# Patient Record
Sex: Male | Born: 1960 | Race: White | Marital: Married | Smoking: Never smoker
Health system: Southern US, Community
[De-identification: ages and names within clinical notes are randomized; demographics above are authoritative.]

## PROBLEM LIST (undated history)

## (undated) HISTORY — PX: POLYPECTOMY: SHX149

---

## 2003-12-18 HISTORY — PX: VASECTOMY: SHX75

## 2013-12-17 HISTORY — PX: COLONOSCOPY: SHX174

## 2014-01-11 ENCOUNTER — Encounter: Payer: Self-pay | Admitting: Internal Medicine

## 2014-02-18 ENCOUNTER — Encounter: Payer: Self-pay | Admitting: Internal Medicine

## 2014-02-18 ENCOUNTER — Ambulatory Visit (AMBULATORY_SURGERY_CENTER): Payer: Self-pay | Admitting: *Deleted

## 2014-02-18 VITALS — Ht 73.0 in | Wt 207.2 lb

## 2014-02-18 DIAGNOSIS — Z1211 Encounter for screening for malignant neoplasm of colon: Secondary | ICD-10-CM

## 2014-02-18 MED ORDER — MOVIPREP 100 G PO SOLR: ORAL | Status: DC

## 2014-02-18 NOTE — Progress Notes (Signed)
No allergies to eggs or soy. No prior anesthesia.  

## 2014-02-26 ENCOUNTER — Encounter: Payer: Self-pay | Admitting: Internal Medicine

## 2014-02-26 ENCOUNTER — Ambulatory Visit (AMBULATORY_SURGERY_CENTER): Payer: BC Managed Care – PPO | Admitting: Internal Medicine

## 2014-02-26 VITALS — BP 133/78 | HR 59 | Temp 97.3°F | Resp 17 | Ht 73.0 in | Wt 207.0 lb

## 2014-02-26 DIAGNOSIS — D126 Benign neoplasm of colon, unspecified: Secondary | ICD-10-CM

## 2014-02-26 DIAGNOSIS — Z1211 Encounter for screening for malignant neoplasm of colon: Secondary | ICD-10-CM

## 2014-02-26 MED ORDER — SODIUM CHLORIDE 0.9 % IV SOLN: 500.0000 mL | INTRAVENOUS | Status: DC

## 2014-02-26 NOTE — Patient Instructions (Signed)
YOU HAD AN ENDOSCOPIC PROCEDURE TODAY AT THE [facility]: Refer to the procedure report that was given to you for any specific questions about what was found during the examination.  If the procedure report does not answer your questions, please call your gastroenterologist to clarify.  If you requested that your care partner not be given the details of your procedure findings, then the procedure report has been included in a sealed envelope for you to review at your convenience later.  YOU SHOULD EXPECT: Some feelings of bloating in the abdomen. Passage of more gas than usual.  Walking can help get rid of the air that was put into your GI tract during the procedure and reduce the bloating. If you had a lower endoscopy (such as a colonoscopy or flexible sigmoidoscopy) you may notice spotting of blood in your stool or on the toilet paper. If you underwent a bowel prep for your procedure, then you may not have a normal bowel movement for a few days.  DIET: Your first meal following the procedure should be a light meal and then it is ok to progress to your normal diet.  A half-sandwich or bowl of soup is an example of a good first meal.  Heavy or fried foods are harder to digest and may make you feel nauseous or bloated.  Likewise meals heavy in dairy and vegetables can cause extra gas to form and this can also increase the bloating.  Drink plenty of fluids but you should avoid alcoholic beverages for 24 hours.  ACTIVITY: Your care partner should take you home directly after the procedure.  You should plan to take it easy, moving slowly for the rest of the day.  You can resume normal activity the day after the procedure however you should NOT DRIVE or use heavy machinery for 24 hours (because of the sedation medicines used during the test).    SYMPTOMS TO REPORT IMMEDIATELY: A gastroenterologist can be reached at any hour.  During normal business hours, 8:30 AM to 5:00 PM Monday through Friday,  call [phone].  After hours and on weekends, please call the GI answering service at [phone] who will take a message and have the physician on call contact you.   Following lower endoscopy (colonoscopy or flexible sigmoidoscopy):  Excessive amounts of blood in the stool  Significant tenderness or worsening of abdominal pains  Swelling of the abdomen that is new, acute  Fever of 100F or higher    FOLLOW UP: If any biopsies were taken you will be contacted by phone or by letter within the next 1-3 weeks.  Call your gastroenterologist if you have not heard about the biopsies in 3 weeks.  Our staff will call the home number listed on your records the next business day following your procedure to check on you and address any questions or concerns that you may have at that time regarding the information given to you following your procedure. This is a courtesy call and so if there is no answer at the home number and we have not heard from you through the emergency physician on call, we will assume that you have returned to your regular daily activities without incident.  SIGNATURES/CONFIDENTIALITY: You and/or your care partner have signed paperwork which will be entered into your electronic medical record.  These signatures attest to the fact that that the information above on your After Visit Summary has been reviewed and is understood.  Full responsibility of the confidentiality   this discharge information lies with you and/or your care-partner.   INFORMATION ON POLYPS GIVEN TO YOU TODAY   AWAIT PATHOLOGY RESULTS  

## 2014-02-26 NOTE — Op Note (Signed)
[facility]  [address] [place], [address]   COLONOSCOPY PROCEDURE REPORT  PATIENT: Tommy Ramirez, Tommy Ramirez  MR#: [id number] BIRTHDATE: Mar 11, 1961 , 52  yrs. old GENDER: Male ENDOSCOPIST: Jerene Bears, MD REFERRED [place]:PXTG Virgina Jock, M.D. PROCEDURE DATE:  02/26/2014 PROCEDURE:   Colonoscopy with snare polypectomy First Screening Colonoscopy - Avg.  risk and is 50 yrs.  old or older Yes.  Prior Negative Screening - Now for repeat screening. N/A  History of Adenoma - Now for follow-up colonoscopy & has been > or = to 3 yrs.  N/A  Polyps Removed Today? Yes. ASA CLASS:   Class II INDICATIONS:average risk screening and first colonoscopy. MEDICATIONS: MAC sedation, administered by CRNA and Propofol (Diprivan) 550 mg IV  DESCRIPTION OF PROCEDURE:   After the risks benefits and alternatives of the procedure were thoroughly explained, informed consent was obtained.  A digital rectal exam revealed no rectal mass.   The LB [id number]  endoscope was introduced through the anus and advanced to the cecum, which was identified by both the appendix and ileocecal valve. No adverse events experienced. The quality of the prep was good, using MoviPrep  The instrument was then slowly withdrawn as the colon was fully examined.   COLON FINDINGS: Two sessile polyps measuring 4-5 mm in size were found in the ascending colon.  Polypectomy was performed using cold snare.  All resections were complete and all polyp tissue was completely retrieved.   The colon mucosa was otherwise normal. Retroflexed views revealed no abnormalities. The time to cecum=3 minutes 08 seconds.  Withdrawal time=16 minutes 20 seconds.  The scope was withdrawn and the procedure completed. COMPLICATIONS: There were no complications.  ENDOSCOPIC IMPRESSION: 1.   Two sessile polyps measuring 4-5 mm in size were found in the ascending colon; Polypectomy was performed using cold snare 2.   The colon mucosa was otherwise  normal  RECOMMENDATIONS: 1.  Await pathology results 2.  If the polyp(s) removed today are proven to be adenomatous (pre-cancerous) polyps, you will need a repeat colonoscopy in 5 years.  Otherwise you should continue to follow colorectal cancer screening guidelines for "routine risk" patients with colonoscopy in 10 years.  You will receive a letter within 1-2 weeks with the results of your biopsy as well as final recommendations.  Please call my office if you have not received a letter after 3 weeks.   eSigned:  Jerene Bears, MD 02/26/2014 10:21 AM cc: The Patient and Shon Baton, MD

## 2014-02-26 NOTE — Progress Notes (Signed)
Called to room to assist during endoscopic procedure.  Patient ID and intended procedure confirmed with present staff. Received instructions for my participation in the procedure from the performing physician.  

## 2014-03-01 ENCOUNTER — Telehealth: Payer: Self-pay | Admitting: *Deleted

## 2014-03-01 NOTE — Telephone Encounter (Signed)
Left message that we called for f/u 

## 2014-03-03 ENCOUNTER — Encounter: Payer: Self-pay | Admitting: Internal Medicine

## 2015-01-25 ENCOUNTER — Other Ambulatory Visit: Payer: Self-pay | Admitting: Internal Medicine

## 2015-01-25 DIAGNOSIS — Z803 Family history of malignant neoplasm of breast: Secondary | ICD-10-CM

## 2017-03-18 ENCOUNTER — Encounter: Payer: Self-pay | Admitting: Cardiovascular Disease

## 2017-04-01 ENCOUNTER — Encounter (INDEPENDENT_AMBULATORY_CARE_PROVIDER_SITE_OTHER): Payer: Self-pay

## 2017-04-01 ENCOUNTER — Ambulatory Visit (INDEPENDENT_AMBULATORY_CARE_PROVIDER_SITE_OTHER): Payer: 59 | Admitting: Cardiovascular Disease

## 2017-04-01 ENCOUNTER — Encounter: Payer: Self-pay | Admitting: Cardiovascular Disease

## 2017-04-01 VITALS — BP 118/80 | HR 70 | Ht 73.0 in | Wt 203.4 lb

## 2017-04-01 DIAGNOSIS — E785 Hyperlipidemia, unspecified: Secondary | ICD-10-CM

## 2017-04-01 DIAGNOSIS — Z8249 Family history of ischemic heart disease and other diseases of the circulatory system: Secondary | ICD-10-CM | POA: Diagnosis not present

## 2017-04-01 DIAGNOSIS — R0609 Other forms of dyspnea: Secondary | ICD-10-CM

## 2017-04-01 NOTE — Progress Notes (Signed)
Cardiology Office Note Date:  04/01/2017   ID:  Sheran Spine, DOB November 13, 1961, MRN [id number]  PCP:  Precious Reel, MD  Cardiologist:  Sherren Mocha, MD    Chief Complaint  Patient presents with  . New Patient (Initial Visit)    hyperlipidemia/family hx cad/doe     History of Present Illness: Tommy Ramirez is a 56 y.o. male who presents for evaluation of shortness of breath.   He has no known hx of CAD. He plays in a men's hockey league and has noted exertional dyspnea this year. No orthopnea, PND, or chest pain. No other exertional symptoms. Feels like he can't keep up with the other players and hasn't had issues with exercise capacity in the past. Denies cough, wheezing, fever, chills, or other complaints. Some limitation from knee pain at present.   He is also concerned about long-term CV risk and would like further risk assessment. CV risk factors include family hx CAD (no premature CAD) and hyperlipidemia (LDL 131 mg/dL).   History reviewed. No pertinent past medical history.  Past Surgical History:  Procedure Laterality Date  . VASECTOMY  2005    Current Outpatient Prescriptions  Medication Sig Dispense Refill  . naproxen sodium (ANAPROX) 220 MG tablet Take 440 mg by mouth 2 (two) times daily as needed (knee inflammation).     No current facility-administered medications for this visit.     Allergies:   Patient has no known allergies.   Social History:  The patient  reports that he has never smoked. He quit smokeless tobacco use about 36 years ago. He reports that he drinks about 8.4 oz of alcohol per week . He reports that he does not use drugs.   Family History:  The patient's  family history includes Cancer in his paternal grandfather and paternal grandmother; Heart attack in his maternal grandfather and mother; Heart murmur in his mother; Hyperlipidemia in his mother; Hypertension in his maternal grandmother; Irregular heart beat in his mother.    ROS:  Please  see the history of present illness.  Otherwise, review of systems is positive for snoring, knee pain.  All other systems are reviewed and negative.    PHYSICAL EXAM: VS:  BP 118/80   Pulse 70   Ht 6\' 1"  (1.854 m)   Wt 203 lb 6.4 oz (92.3 kg)   BMI 26.84 kg/m  , BMI Body mass index is 26.84 kg/m. GEN: Well nourished, well developed, in no acute distress  HEENT: normal  Neck: no JVD, no masses. No carotid bruits Cardiac: RRR without murmur or gallop                Respiratory:  clear to auscultation bilaterally, normal work of breathing GI: soft, nontender, nondistended, + BS MS: no deformity or atrophy  Ext: no pretibial edema, pedal pulses 2+= bilaterally Skin: warm and dry, no rash Neuro:  Strength and sensation are intact Psych: euthymic mood, full affect  EKG:  EKG is ordered today. The ekg ordered today shows NSR 70 bpm, within normal limits  Recent Labs: No results found for requested labs within last 8760 hours.   Lipid Panel  No results found for: CHOL, TRIG, HDL, CHOLHDL, VLDL, LDLCALC, LDLDIRECT    Wt Readings from Last 3 Encounters:  04/01/17 203 lb 6.4 oz (92.3 kg)  02/26/14 207 lb (93.9 kg)  02/18/14 207 lb 3.2 oz (94 kg)     ASSESSMENT AND PLAN: 1.  Shortness of breath: no abnormalities appreciated on his  exam. No smoking history or occupational exposures. Will check 2D echo and exercise treadmill study to assess LV/RV function, presence of any valvular disease, and assess exercise capacity/ischemia evaluation.  2. Hyperlipidemia: chol 207, LDL 131, HDL 49. Check coronary CT for Ca++. Explained rationale to patient. Lifestyle modification discussed at length. Consider statin if high Ca++ score.  Current medicines are reviewed with the patient today.  The patient does not have concerns regarding medicines.  Labs/ tests ordered today include:  No orders of the defined types were placed in this encounter.   Disposition:   FU one year  Signed, Sherren Mocha, MD  04/01/2017 11:25 AM    [facility] [address], [place], [place]  [address] Phone: [phone]; Fax: [phone]

## 2017-04-01 NOTE — Patient Instructions (Addendum)
Medication Instructions:  Your physician recommends that you continue on your current medications as directed. Please refer to the Current Medication list given to you today.  Labwork: No new orders.   Testing/Procedures: Your physician has requested that you have an echocardiogram. Echocardiography is a painless test that uses sound waves to create images of your heart. It provides your doctor with information about the size and shape of your heart and how well your heart's chambers and valves are working. This procedure takes approximately one hour. There are no restrictions for this procedure.  Your physician has requested a Coronary Calcium Score.  This test is $150.00 out of pocket at time of test.   Your physician has requested that you have an exercise tolerance test. For further information please visit [email or web address]. Please contact the office is you would like to proceed with this test.   Follow-Up: Your physician wants you to follow-up in: 1 YEAR with Dr Burt Knack.  You will receive a reminder letter in the mail two months in advance. If you don't receive a letter, please call our office to schedule the follow-up appointment.   Any Other Special Instructions Will Be Listed Below (If Applicable).     If you need a refill on your cardiac medications before your next appointment, please call your pharmacy.

## 2017-04-15 ENCOUNTER — Ambulatory Visit (INDEPENDENT_AMBULATORY_CARE_PROVIDER_SITE_OTHER)
Admission: RE | Admit: 2017-04-15 | Discharge: 2017-04-15 | Disposition: A | Discharge status: Home / Self Care | Payer: Self-pay | Source: Ambulatory Visit | Attending: Cardiovascular Disease | Admitting: Cardiovascular Disease

## 2017-04-15 ENCOUNTER — Ambulatory Visit (HOSPITAL_COMMUNITY): Payer: 59 | Attending: Cardiology

## 2017-04-15 ENCOUNTER — Other Ambulatory Visit: Payer: Self-pay

## 2017-04-15 DIAGNOSIS — R0602 Shortness of breath: Secondary | ICD-10-CM

## 2017-04-15 DIAGNOSIS — I420 Dilated cardiomyopathy: Secondary | ICD-10-CM | POA: Insufficient documentation

## 2017-04-15 DIAGNOSIS — I5030 Unspecified diastolic (congestive) heart failure: Secondary | ICD-10-CM | POA: Insufficient documentation

## 2017-04-15 DIAGNOSIS — R0609 Other forms of dyspnea: Secondary | ICD-10-CM | POA: Diagnosis not present

## 2017-04-15 DIAGNOSIS — Z8249 Family history of ischemic heart disease and other diseases of the circulatory system: Secondary | ICD-10-CM

## 2017-04-15 DIAGNOSIS — E785 Hyperlipidemia, unspecified: Secondary | ICD-10-CM

## 2017-04-26 ENCOUNTER — Ambulatory Visit (INDEPENDENT_AMBULATORY_CARE_PROVIDER_SITE_OTHER): Payer: 59

## 2017-04-26 DIAGNOSIS — R0602 Shortness of breath: Secondary | ICD-10-CM | POA: Diagnosis not present

## 2017-04-28 LAB — EXERCISE TOLERANCE TEST
CHL CUP RESTING HR STRESS: 65 {beats}/min
CHL RATE OF PERCEIVED EXERTION: 17
CSEPED: 10 min
CSEPEDS: 0 s
CSEPEW: 11.7 METS
CSEPHR: 89 %
MPHR: 166 {beats}/min
Peak HR: 148 {beats}/min

## 2017-05-22 ENCOUNTER — Other Ambulatory Visit: Payer: Self-pay

## 2017-05-22 DIAGNOSIS — E785 Hyperlipidemia, unspecified: Secondary | ICD-10-CM

## 2017-08-04 ENCOUNTER — Emergency Department (HOSPITAL_COMMUNITY): Payer: 59

## 2017-08-04 ENCOUNTER — Emergency Department (HOSPITAL_COMMUNITY)
Admission: EM | Admit: 2017-08-04 | Discharge: 2017-08-05 | Disposition: A | Discharge status: Home / Self Care | Payer: 59 | Attending: Emergency Medicine | Admitting: Emergency Medicine

## 2017-08-04 ENCOUNTER — Encounter (HOSPITAL_COMMUNITY): Payer: Self-pay | Admitting: Emergency Medicine

## 2017-08-04 DIAGNOSIS — Y999 Unspecified external cause status: Secondary | ICD-10-CM | POA: Diagnosis not present

## 2017-08-04 DIAGNOSIS — S41112A Laceration without foreign body of left upper arm, initial encounter: Secondary | ICD-10-CM | POA: Diagnosis not present

## 2017-08-04 DIAGNOSIS — Y929 Unspecified place or not applicable: Secondary | ICD-10-CM | POA: Insufficient documentation

## 2017-08-04 DIAGNOSIS — Y9389 Activity, other specified: Secondary | ICD-10-CM | POA: Diagnosis not present

## 2017-08-04 DIAGNOSIS — S4992XA Unspecified injury of left shoulder and upper arm, initial encounter: Secondary | ICD-10-CM | POA: Diagnosis present

## 2017-08-04 DIAGNOSIS — W228XXA Striking against or struck by other objects, initial encounter: Secondary | ICD-10-CM | POA: Diagnosis not present

## 2017-08-04 NOTE — ED Notes (Signed)
Pt was playing hockey and puck hit L forearm. Small lac noted to L forearm, approx 1cm. Bleeding controlled.

## 2017-08-05 MED ORDER — LIDOCAINE-EPINEPHRINE (PF) 2 %-1:200000 IJ SOLN
10.0000 mL | Freq: Once | INTRAMUSCULAR | Status: AC
  Administered 2017-08-05: 10 mL
  Filled 2017-08-05: qty 20

## 2017-08-05 NOTE — ED Provider Notes (Signed)
[facility] DEPT Provider Note   CSN: [id number] Arrival date & time: 08/04/17  2322     History   Chief Complaint Chief Complaint  Patient presents with  . Laceration    HPI Tommy Ramirez is a 56 y.o. male.  HPI  Patient presents to ED for evaluation of left forearm laceration that occurred 3 hours ago. He states that he was playing hockey when the hockey puck hit him on the arm. He applied pressure to the area to stop the bleeding. He reports full active and passive range of motion of the wrist and arm. He denies any head injury or loss of consciousness. Reports tetanus is up-to-date.  History reviewed. No pertinent past medical history.  There are no active problems to display for this patient.   Past Surgical History:  Procedure Laterality Date  . VASECTOMY  2005       Home Medications    Prior to Admission medications   Medication Sig Start Date End Date Taking? Authorizing Provider  naproxen sodium (ANAPROX) 220 MG tablet Take 440 mg by mouth 2 (two) times daily as needed (knee inflammation).    [provider]    Family History Family History  Problem Relation Age of Onset  . Heart attack Mother   . Hyperlipidemia Mother   . Heart murmur Mother   . Irregular heart beat Mother   . Hypertension Maternal Grandmother   . Heart attack Maternal Grandfather   . Cancer Paternal Grandmother   . Cancer Paternal Grandfather   . Colon cancer Neg Hx     Social History Social History  Substance Use Topics  . Smoking status: Never Smoker  . Smokeless tobacco: Former Systems developer    Quit date: 12/17/1980  . Alcohol use 8.4 oz/week    14 Glasses of wine per week     Allergies   Patient has no known allergies.   Review of Systems Review of Systems  Constitutional: Negative for chills and fever.  Gastrointestinal: Negative for nausea and vomiting.  Skin: Positive for wound.  Neurological: Negative for weakness, numbness and headaches.     Physical  Exam Updated Vital Signs BP (!) 148/95 (BP Location: Right Arm)   Pulse 93   Temp 98.9 F (37.2 C) (Oral)   Resp 16   Ht 6\' 1"  (1.854 m)   Wt 88.5 kg (195 lb)   SpO2 98%   BMI 25.73 kg/m   Physical Exam  Constitutional: He appears well-developed and well-nourished. No distress.  Nontoxic appearing and in no acute distress. Resting comfortably on chair reading a book.  HENT:  Head: Normocephalic and atraumatic.  Eyes: Conjunctivae and EOM are normal. No scleral icterus.  Neck: Normal range of motion.  Pulmonary/Chest: Effort normal. No respiratory distress.  Neurological: He is alert.  Skin: Laceration noted. No rash noted. He is not diaphoretic.  1 cm laceration noted to left forearm. Sensation intact to light touch. 2+ radial pulse. Full active and passive range of motion of the elbow, wrist and digits. Equal grip strength bilaterally.  Psychiatric: He has a normal mood and affect.  Nursing note and vitals reviewed.    ED Treatments / Results  Labs (all labs ordered are listed, but only abnormal results are displayed) Labs Reviewed - No data to display  EKG  EKG Interpretation None       Radiology Dg Forearm Left  Result Date: 08/04/2017 CLINICAL DATA:  Hockey puck struck left forearm, laceration. EXAM: LEFT FOREARM - 2 VIEW  COMPARISON:  None. FINDINGS: There is no evidence of fracture or other focal bone lesions. Soft tissue edema distally with overlying dressing in place. No radiopaque foreign body. IMPRESSION: Distal soft tissue edema without acute fracture. Electronically Signed   By: Jeb Levering M.D.   On: 08/04/2017 23:55    Procedures .Marland KitchenLaceration Repair Date/Time: 08/05/2017 12:17 AM Performed by: Delia Heady Authorized by: Delia Heady   Consent:    Consent obtained:  Verbal   Consent given by:  Patient   Risks discussed:  Infection, pain and poor cosmetic result Laceration details:    Location:  Shoulder/arm   Shoulder/arm location:  L lower  arm   Length (cm):  1 Repair type:    Repair type:  Simple Pre-procedure details:    Preparation:  Patient was prepped and draped in usual sterile fashion Exploration:    Hemostasis achieved with:  Epinephrine Treatment:    Area cleansed with:  Saline   Amount of cleaning:  Standard   Irrigation solution:  Sterile saline   Irrigation method:  Syringe Skin repair:    Repair method:  Sutures   Suture size:  5-0   Wound skin closure material used: Ethilon.   Suture technique:  Simple interrupted   Number of sutures:  2 Approximation:    Approximation:  Close Post-procedure details:    Patient tolerance of procedure:  Tolerated well, no immediate complications    (including critical care time)  Medications Ordered in ED Medications  lidocaine-EPINEPHrine (XYLOCAINE W/EPI) 2 %-1:200000 (PF) injection 10 mL (not administered)     Initial Impression / Assessment and Plan / ED Course  I have reviewed the triage vital signs and the nursing notes.  Pertinent labs & imaging results that were available during my care of the patient were reviewed by me and considered in my medical decision making (see chart for details).     Patient presents to ED for evaluation of laceration on left forearm that occurred prior to arrival while playing hockey and being hit with a hockey puck. On physical exam there is a 1 cm laceration on left forearm. Bleeding is controlled. X-ray was negative for fracture or foreign body. Area was closed with 2 simple interrupted suture after being irrigated with saline. Patient states tetanus is up-to-date. He denies any other symptoms at this time. Advised to return for suture removal in 7 days. Patient appears stable for discharge at this time. Strict return precautions given.  Final Clinical Impressions(s) / ED Diagnoses   Final diagnoses:  Laceration of left upper extremity, initial encounter    New Prescriptions New Prescriptions   No medications on file       Delia Heady, PA-C 08/05/17 0034    [facility], MD 08/05/17 [phone]

## 2017-08-05 NOTE — ED Notes (Signed)
Pt st's while playing hockey he was hit in left forearm with a hockey puck.  Bleeding controlled at this time.  Small laceration noted to left forearm

## 2017-08-05 NOTE — Discharge Instructions (Signed)
Please read attached information regarding your condition. Return in 7 days for suture removal. Follow-up with PCP for further evaluation. Return to ED for additional injury, signs of infection, numbness.

## 2017-10-28 ENCOUNTER — Other Ambulatory Visit: Payer: 59

## 2017-12-08 IMAGING — CT CT HEART SCORING
2 series · 16 of 20 positions shown, 18 images · non-contrast
Comparison: None.

CLINICAL DATA: Risk stratification

EXAM:
Coronary Calcium Score
TECHNIQUE: The patient was scanned on a Siemens Somatom 64 slice scanner. Axial
non-contrast 3 mm slices were carried out through the heart. The
data set was analyzed on a dedicated work station and scored using
the Agatson method.

[Series 2: casc 3.0 i36f 2 bestdiast 70 % · axial · 0.40mm/px · z∈[-246,-138]mm · 8 of 48 slices shown, 10 images]
[im 6/48  vessel]
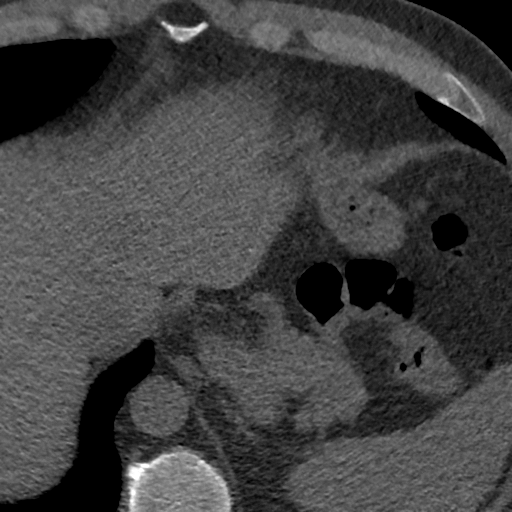
[im 6/48  lung]
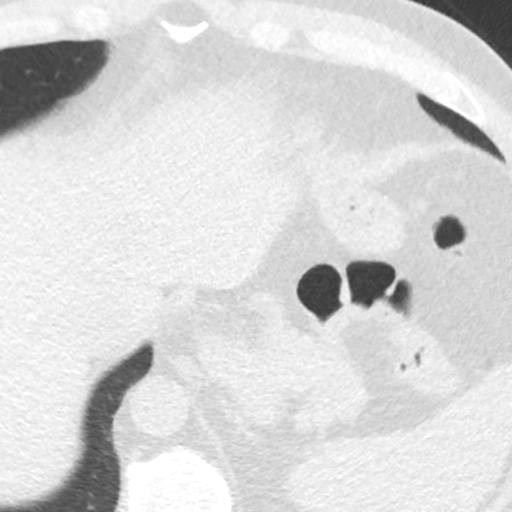
[im 11/48  vessel]
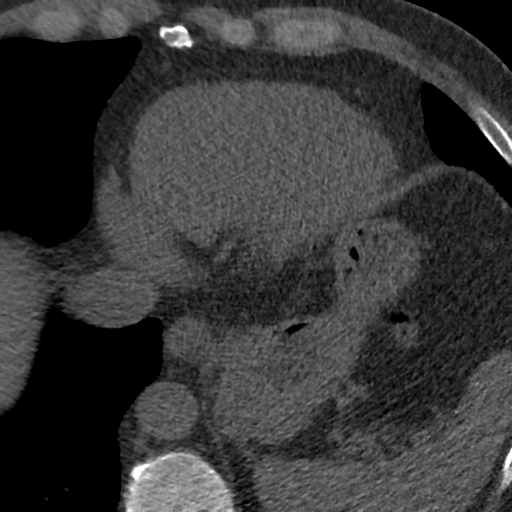
[im 16/48  vessel]
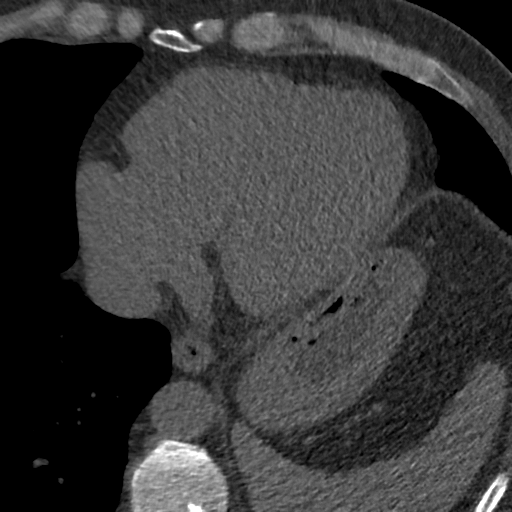
[im 21/48  vessel]
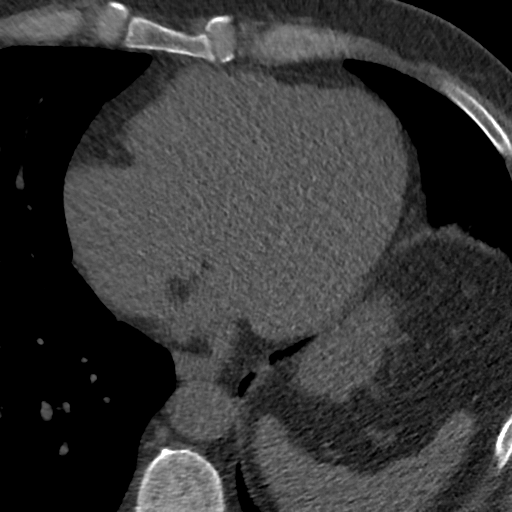
[im 27/48  vessel]
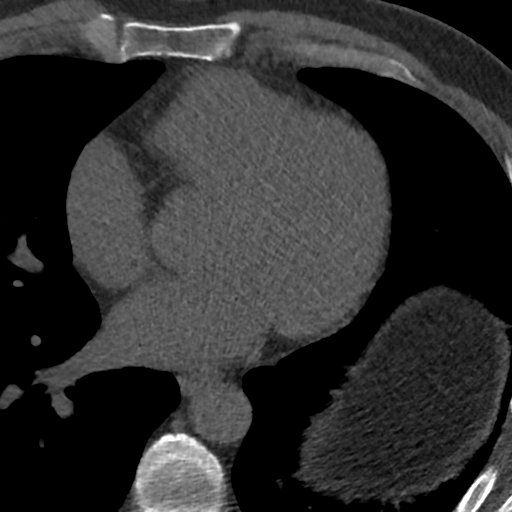
[im 27/48  lung]
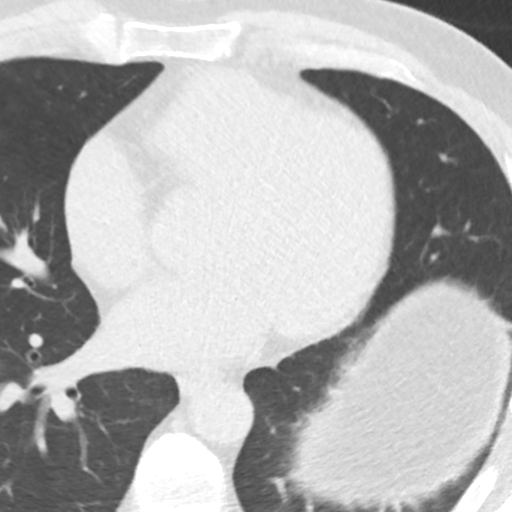
[im 32/48  vessel]
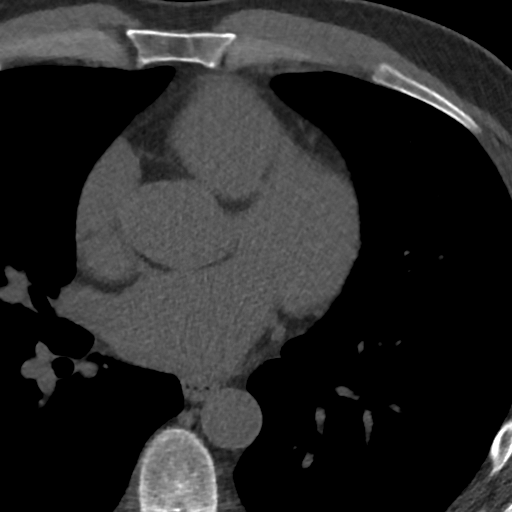
[im 37/48  vessel]
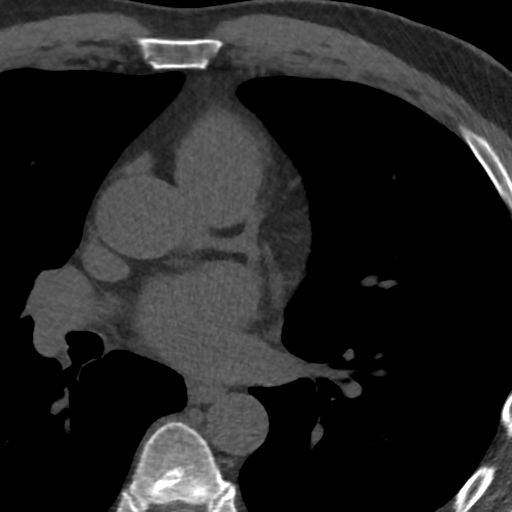
[im 42/48  vessel]
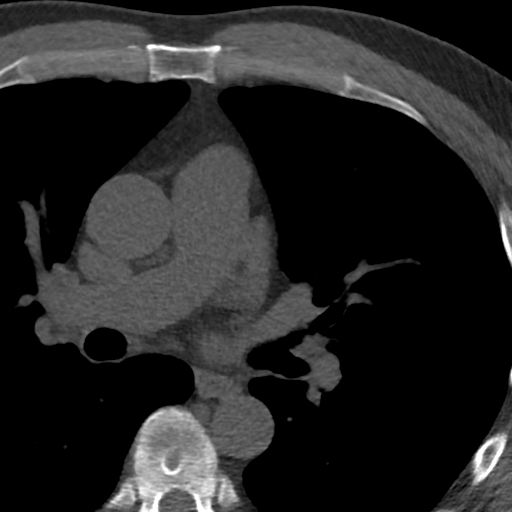

[Series 4: lung st 69 % · axial · 0.68mm/px · z∈[-246,-138]mm · 8 of 48 slices shown]
[im 6/48  lung]
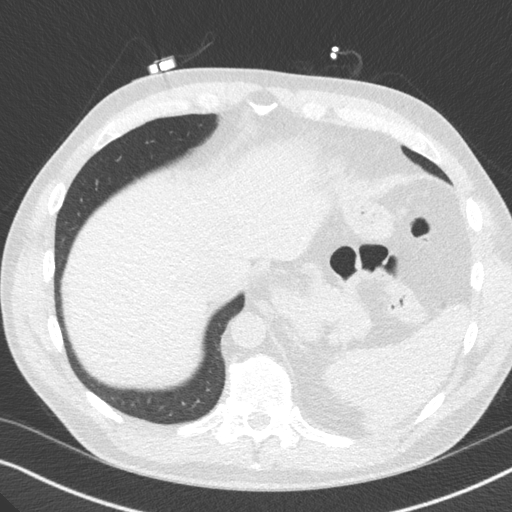
[im 11/48  lung]
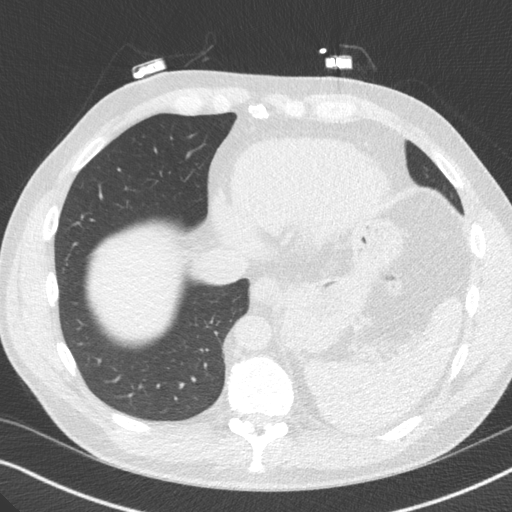
[im 16/48  lung]
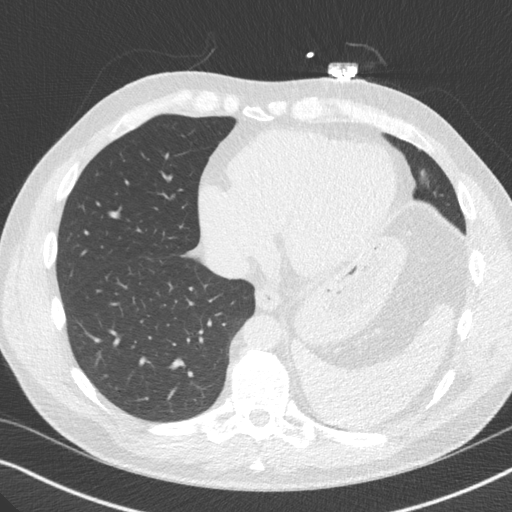
[im 21/48  lung]
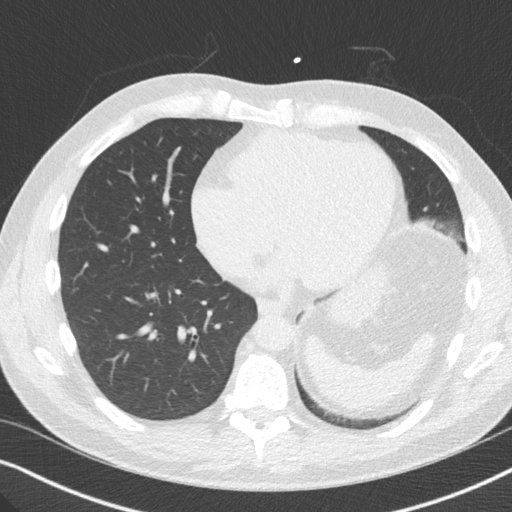
[im 27/48  lung]
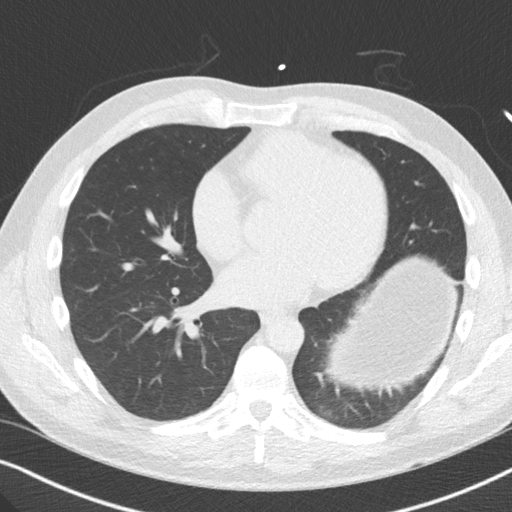
[im 32/48  lung]
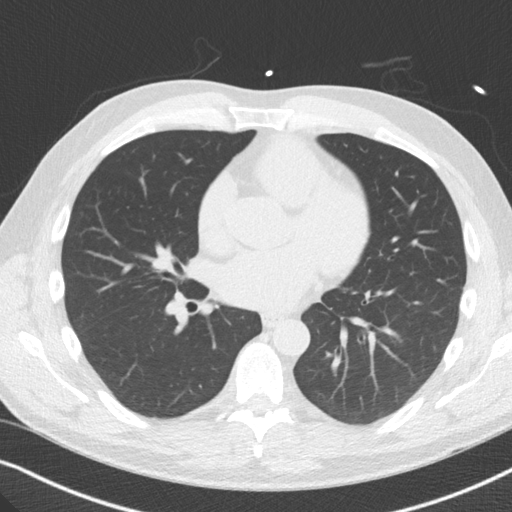
[im 37/48  lung]
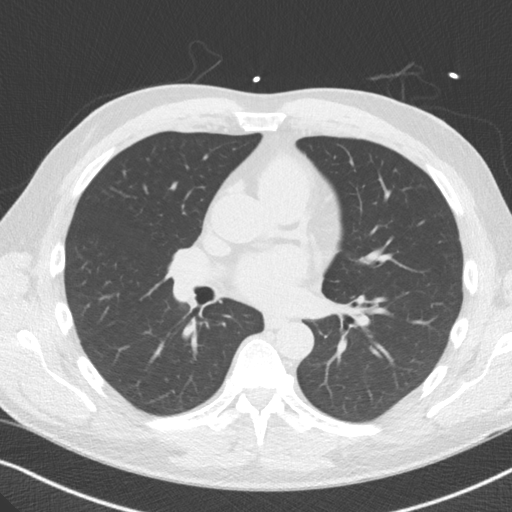
[im 42/48  lung]
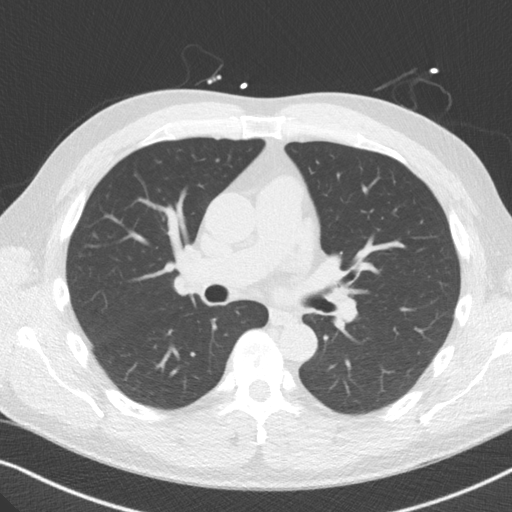

[16 of 20 positions shown; findings below may reference images not displayed]

FINDINGS: Non-cardiac: See separate report from [REDACTED].

Ascending Aorta:  Normal size.  No calcifications.

Pericardium: Normal.

Coronary arteries:  Normal origin.  Left dominance.
IMPRESSION: Coronary calcium score of 8. This was 38 percentile for age and sex
matched control.

Savita Pilgrim

EXAM:
OVER-READ INTERPRETATION  CT CHEST

The following report is an over-read performed by radiologist Dr.
over-read does not include interpretation of cardiac or coronary
anatomy or pathology. The coronary calcium score interpretation by
the cardiologist is attached.
FINDINGS: Within the visualized portions of the thorax there are no suspicious
appearing pulmonary nodules or masses, there is no acute
consolidative airspace disease, no pleural effusions, no
pneumothorax and no lymphadenopathy. Visualized portions of the
upper abdomen are unremarkable. There are no aggressive appearing
lytic or blastic lesions noted in the visualized portions of the
skeleton.
IMPRESSION: 1. No significant incidental noncardiac findings are noted.

## 2018-01-15 ENCOUNTER — Other Ambulatory Visit: Payer: 59 | Admitting: *Deleted

## 2018-01-15 DIAGNOSIS — E785 Hyperlipidemia, unspecified: Secondary | ICD-10-CM

## 2018-01-15 LAB — LIPID PANEL
CHOLESTEROL TOTAL: 200 mg/dL — AB (ref 100–199)
Chol/HDL Ratio: 3.9 ratio (ref 0.0–5.0)
HDL: 51 mg/dL (ref >39)
LDL Calculated: 124 mg/dL — ABNORMAL HIGH (ref 0–99)
Triglycerides: 125 mg/dL (ref 0–149)
VLDL Cholesterol Cal: 25 mg/dL (ref 5–40)

## 2018-01-17 ENCOUNTER — Encounter: Payer: 59 | Admitting: Internal Medicine

## 2018-01-17 ENCOUNTER — Telehealth: Payer: Self-pay | Admitting: Cardiovascular Disease

## 2018-01-17 NOTE — Telephone Encounter (Signed)
Informed patient of results and verbal understanding expressed.   Encouraged patient to work on lifestyle, diet, and exercise without medication at this time. Scheduled patient for 1 year follow-up with Dr. Burt Knack in April. He was grateful for call.

## 2018-01-17 NOTE — Telephone Encounter (Signed)
-----   Message from Sherren Mocha, MD sent at 01/16/2018 10:49 PM EST ----- Based on risk calculator for his specific ASCVD risk, his risk is <5% and statin not indicated. Would continue to work on lifestyle/diet/exercise without medication at this time.

## 2018-01-17 NOTE — Telephone Encounter (Signed)
New Message   Patient is returning call in reference to his lab results. Please call to discuss.

## 2018-02-07 ENCOUNTER — Ambulatory Visit (INDEPENDENT_AMBULATORY_CARE_PROVIDER_SITE_OTHER): Payer: 59 | Admitting: Internal Medicine

## 2018-02-07 ENCOUNTER — Encounter: Payer: Self-pay | Admitting: Internal Medicine

## 2018-02-07 DIAGNOSIS — Z9189 Other specified personal risk factors, not elsewhere classified: Secondary | ICD-10-CM

## 2018-02-07 DIAGNOSIS — Z7184 Encounter for health counseling related to travel: Secondary | ICD-10-CM | POA: Insufficient documentation

## 2018-02-07 DIAGNOSIS — Z7185 Encounter for immunization safety counseling: Secondary | ICD-10-CM

## 2018-02-07 DIAGNOSIS — Z789 Other specified health status: Secondary | ICD-10-CM

## 2018-02-07 DIAGNOSIS — Z7189 Other specified counseling: Secondary | ICD-10-CM

## 2018-02-07 MED ORDER — ATOVAQUONE-PROGUANIL HCL 250-100 MG PO TABS: 1.0000 | ORAL_TABLET | Freq: Every day | ORAL | 0 refills | Status: DC

## 2018-02-07 MED ORDER — AZITHROMYCIN 500 MG PO TABS: 1000.0000 mg | ORAL_TABLET | Freq: Once | ORAL | 0 refills | Status: AC

## 2018-02-07 NOTE — Progress Notes (Signed)
Subjective:   Tommy Ramirez is a 57 y.o. male who presents to the Infectious Disease clinic for travel consultation. Planned departure date: March 03, 2018          Planned return date: 5 days Countries of travel: [place] Areas in country: resort   Accommodations: hotel Purpose of travel: vacation Prior travel out of [place]: yes     Objective:   Medications: reviewed   Assessment:   No contraindications to travel. none     Plan:    Issues discussed: environmental concerns, future shots, insect-borne illnesses, malaria, MVA safety, rabies, safe food/water, traveler's diarrhea, website/handouts for more information, what to do if ill upon return and what to do if ill while there. Immunizations recommended: none indicated. Malaria prophylaxis: malarone, daily dose starting 1-2 days before entering endemic area, ending 7 days after leaving area Traveler's diarrhea prophylaxis: azithromycin. Total duration of visit: 1 Hour. Total time spent on education, counseling, coordination of care: 30 Minutes.

## 2018-02-07 NOTE — Patient Instructions (Signed)
[facility]                [address], [place]                   [place], [place] [address]                      Phone: [phone]                        Fax: [phone]   Planned departure date: March 03 2018          Planned return date: 5 days Countries of travel: [place]   Guidelines for the Prevention & Treatment of Traveler's Diarrhea  Prevention: "Boil it, Peel it, [place] it, or Forget it"   the fewer chances -> lower risk: try to stick to food & water precautions as much as possible"   If it's "piping hot"; it is probably okay, if not, it may not be   Treatment   1) You should always take care to drink lots of fluids in order to avoid dehydration   2) You should bring medications with you in case you come down with a case of diarrhea   3) OTC = bring pepto-bismol - can take with initial abdominal symptoms;                    Imodium - can help slow down your intestinal tract, can help relief cramps                    and diarrhea, can take if no bloody diarrhea  Use azithromycin if needed for traveler's diarrhea  Guidelines for the Prevention of Malaria  Avoidance:  -fewer mosquito bites = lower risk. Mosquitos can bite at night as well as daytime  -cover up (long sleeve clothing), mosquito nets, screens  -Insect repellent for your skin ( DEET containing lotion > 20%): for clothes ( permethrin spray)   2 days prior to travel, start malarone, daily dose starting 1-2 days before entering endemic area, ending 7 days after leaving area for malaria prevention.   Immunizations received today: none indicated  Future immunizations, if indicated none indicated   Prior to travel:  1) Be sure to pick up appropriate prescriptions, including medicine you take daily. Do not expect to be able to fill your prescriptions abroad.  2) Strongly consider obtaining traveler's insurance, including emergency evacuation  insurance. Most plans in the [place] do not cover participants abroad. (see below for resources)  3) Register at the appropriate [place] embassy or consulate with travel dates so they are aware of your presence in-country and for helpful advice during travel using the [employer] (STEP, GreenNylon.com.cy).  4) Leave contact information with a relative or friend.  5) Keep a Research officer, political party, credit cards in case they become lost or stolen  6) Inform your credit card company that you will be travelling abroad   During travel:  1) If you become ill and need medical advice, the [place] [employer]	[place] of the country you are traveling in general provides a list of [place] speaking doctors.  We are also available on MyChart for remote consultation if you register prior to travel. 2) Avoid motorcycles or scooters when at all possible. Traffic laws in many countries are lax and accidents occur frequently.  3) Do  not take any unnecessary risks that you wouldn't do at home.   Resources:  -Country specific information: BlindResource.ca or GreenNylon.com.cy  -Press photographer (DEET, mosquito nets): [employer], [employer] store, [employer], [place] insurance options: gatewayplans.com; http://clayton-rivera.info/; travelguard.com or [employer], gninsurance.com or info@gninsurance .com, ([phone].   Post Travel:  If you return from your trip ill, call your primary care doctor or our travel clinic @ [phone].   Enjoy your trip and know that with proper pre-travel preparation, most people have an enjoyable and uninterrupted trip!

## 2018-03-31 ENCOUNTER — Encounter: Payer: Self-pay | Admitting: Cardiovascular Disease

## 2018-03-31 ENCOUNTER — Ambulatory Visit: Payer: 59 | Admitting: Cardiovascular Disease

## 2018-03-31 ENCOUNTER — Encounter (INDEPENDENT_AMBULATORY_CARE_PROVIDER_SITE_OTHER): Payer: Self-pay

## 2018-03-31 VITALS — BP 120/78 | HR 61 | Ht 73.0 in | Wt 194.0 lb

## 2018-03-31 DIAGNOSIS — R0602 Shortness of breath: Secondary | ICD-10-CM | POA: Diagnosis not present

## 2018-03-31 DIAGNOSIS — R931 Abnormal findings on diagnostic imaging of heart and coronary circulation: Secondary | ICD-10-CM

## 2018-03-31 NOTE — Progress Notes (Signed)
Cardiology Office Note Date:  03/31/2018   ID:  Tommy Ramirez, DOB Sep 21, 1961, MRN [id number]  PCP:  Tommy Baton, MD  Cardiologist:  Tommy Mocha, MD    Chief Complaint  Patient presents with  . Shortness of Breath     History of Present Illness: Tommy Ramirez is a 57 y.o. male who presents for follow-up evaluation. The patient has hyperlipidemia and a family history of coronary artery disease. At the time of last year's evaluation he had an exercise treadmill study, coronary calcium score, and an echocardiogram.   Tommy Ramirez is here alone today. Overall doing well. Continues to exercise regularly and feels that he is short of breath with skating during his hockey games. This is unchanged from last year. He denies shortness of breath with other activities. No orthopnea, PND, or leg swelling. No chest pain with exertion. No other complaints.   History reviewed. No pertinent past medical history.  Past Surgical History:  Procedure Laterality Date  . VASECTOMY  2005    Current Outpatient Medications  Medication Sig Dispense Refill  . atovaquone-proguanil (MALARONE) 250-100 MG TABS tablet Take 1 tablet by mouth daily. Start 2 days prior to travel to malaria area, throughout travel and for 7 days upon return. 14 tablet 0  . naproxen sodium (ANAPROX) 220 MG tablet Take 440 mg by mouth 2 (two) times daily as needed (knee inflammation).     No current facility-administered medications for this visit.     Allergies:   Patient has no known allergies.   Social History:  The patient  reports that he has never smoked. He quit smokeless tobacco use about 37 years ago. He reports that he drinks about 8.4 oz of alcohol per week. He reports that he does not use drugs.   Family History:  The patient's family history includes Cancer in his paternal grandfather and paternal grandmother; Heart attack in his maternal grandfather and mother; Heart murmur in his mother; Hyperlipidemia in his mother;  Hypertension in his maternal grandmother; Irregular heart beat in his mother.    ROS:  Please see the history of present illness.  Otherwise, review of systems is positive for low back pain, fatigue.  All other systems are reviewed and negative.    PHYSICAL EXAM: VS:  BP 120/78   Pulse 61   Ht 6\' 1"  (1.854 m)   Wt 194 lb (88 kg)   SpO2 97%   BMI 25.60 kg/m  , BMI Body mass index is 25.6 kg/m. GEN: Well nourished, well developed, in no acute distress  HEENT: normal  Neck: no JVD, no masses. No carotid bruits Cardiac: RRR without murmur or gallop                Respiratory:  clear to auscultation bilaterally, normal work of breathing GI: soft, nontender, nondistended, + BS MS: no deformity or atrophy  Ext: no pretibial edema, pedal pulses 2+= bilaterally Skin: warm and dry, no rash Neuro:  Strength and sensation are intact Psych: euthymic mood, full affect  EKG:  EKG is ordered today. The ekg ordered today shows NSR 61 bpm, within normal limits  Recent Labs: No results found for requested labs within last 8760 hours.   Lipid Panel     Component Value Date/Time   CHOL 200 (H) 01/15/2018 0853   TRIG 125 01/15/2018 0853   HDL 51 01/15/2018 0853   CHOLHDL 3.9 01/15/2018 0853   LDLCALC 124 (H) 01/15/2018 0853      Wt Readings from  Last 3 Encounters:  03/31/18 194 lb (88 kg)  08/04/17 195 lb (88.5 kg)  04/01/17 203 lb 6.4 oz (92.3 kg)     Cardiac Studies Reviewed: Echo 04-15-2017: Study Conclusions  - Left ventricle: The cavity size was normal. Wall thickness was   normal. Systolic function was normal. The estimated ejection   fraction was in the range of 50% to 55%. Wall motion was normal;   there were no regional wall motion abnormalities. Doppler   parameters are consistent with abnormal left ventricular   relaxation (grade 1 diastolic dysfunction). - Aortic root: The aortic root was mildly dilated. - Left atrium: The atrium was mildly  dilated.  Impressions:  - Normal LV systolic function; mild diastolic dysfunction; mildly   dilated aortic root; mild LAE.  ETT 04-26-2017: Study Highlights    Blood pressure demonstrated a normal response to exercise.  There was no ST segment deviation noted during stress.  Clinically and electrically negative for ischemia Excellent exercise tolertnce    Stress Findings   ECG Baseline ECG exhibits normal sinus rhythm..  Stress Findings The patient exercised following the Bruce protocol.  The patient reported no symptoms during the stress test. The patient experienced no angina during the stress test.   The patient requested the test to be stopped.   Blood pressure and heart rate demonstrated a normal response to exercise. Blood pressure demonstrated a normal response to exercise. Overall, the patient's exercise capacity was excellent.   85% of maximum heart rate was achieved after 9.1 minutes. Recovery time: 5 minutes. The patient's response to exercise was adequate for diagnosis.  Response to Stress There was no ST segment deviation noted during stress.  Arrhythmias during stress: none.  Arrhythmias during recovery: none.  There were no significant arrhythmias noted during the test.  ECG was interpretable and conclusive.  Stress Measurements   Baseline Vitals  Rest HR 65 bpm    Rest BP 136/81 mmHg    Exercise Time  Exercise duration (min) 10 min    Exercise duration (sec) 0 sec    Peak Stress Vitals  Peak HR 148 bpm    Peak BP 182/71 mmHg    Exercise Data  MPHR 166 bpm    Percent HR 89 %    RPE 17     Estimated workload 11.7 METS       Coronary Ca CT: FINDINGS: Non-cardiac: See separate report from [facility].  Ascending Aorta:  Normal size.  No calcifications.  Pericardium: Normal.  Coronary arteries:  Normal origin.  Left dominance.  IMPRESSION: Coronary calcium score of 8. This was 46 percentile for age and sex matched  control.  ASSESSMENT AND PLAN: 1.  Shortness of breath: The patient has mild functional limitation with shortness of breath when he plays hockey.  His exercise treadmill testing is normal.  His echo study showed normal LV systolic function and no significant valvular disease.  Mild grade 1 diastolic dysfunction is noted.  The patient has a normal cardiac exam and he is normotensive.  He is encouraged to continue with a routine exercise program and regular aerobic activity.  2.  Coronary calcification: The patient has a coronary calcium score of 8 which places him at the 38th percentile of an age and gender matched cohort.  He does have a family history of coronary artery disease with his mother having an MI in her 49s.  We discussed cardiovascular risk reduction measures over time in the context of  is specific risk  profile.  He had recent lipids drawn with a cholesterol of 194, HDL 48, and LDL 118.  We reviewed the pros and cons of statin use for primary prevention.  The patient prefers to continue with efforts at lifestyle modification.  I will repeat a coronary calcium score next year and see him back in follow-up at that time.  His lipids are followed by Dr. Virgina Jock.  Current medicines are reviewed with the patient today.  The patient does not have concerns regarding medicines.  Labs/ tests ordered today include:   Orders Placed This Encounter  Procedures  . CT CARDIAC SCORING    Disposition:   FU one year with a CT calcium test  Signed, Tommy Mocha, MD  03/31/2018 9:23 AM    [facility] [address], [place], [place]  [address] Phone: [phone]; Fax: [phone]

## 2018-03-31 NOTE — Addendum Note (Signed)
Addended by: Harland German A on: 03/31/2018 01:06 PM   Modules accepted: Orders

## 2018-03-31 NOTE — Patient Instructions (Signed)
Medication Instructions:  Your provider recommends that you continue on your current medications as directed. Please refer to the Current Medication list given to you today.    Labwork: None  Testing/Procedures: Dr. Burt Knack recommends you have a CALCIUM SCORE in 1 year.  Follow-Up: Your provider wants you to follow-up in: 1 year with Dr. Burt Knack. You will receive a reminder letter in the mail two months in advance. If you don't receive a letter, please call our office to schedule the follow-up appointment.    Any Other Special Instructions Will Be Listed Below (If Applicable).     If you need a refill on your cardiac medications before your next appointment, please call your pharmacy.

## 2019-03-01 ENCOUNTER — Encounter: Payer: Self-pay | Admitting: Internal Medicine

## 2019-03-09 ENCOUNTER — Inpatient Hospital Stay: Admission: RE | Admit: 2019-03-09 | Payer: 59 | Source: Ambulatory Visit

## 2020-04-04 ENCOUNTER — Encounter: Payer: Self-pay | Admitting: Internal Medicine

## 2020-05-10 ENCOUNTER — Encounter: Payer: 59 | Admitting: Internal Medicine

## 2020-05-27 ENCOUNTER — Ambulatory Visit (AMBULATORY_SURGERY_CENTER): Payer: Self-pay

## 2020-05-27 ENCOUNTER — Other Ambulatory Visit: Payer: Self-pay

## 2020-05-27 VITALS — Ht 73.0 in | Wt 199.6 lb

## 2020-05-27 DIAGNOSIS — Z86010 Personal history of colonic polyps: Secondary | ICD-10-CM

## 2020-05-27 MED ORDER — SUTAB 1479-225-188 MG PO TABS: 12.0000 | ORAL_TABLET | ORAL | 0 refills | Status: DC

## 2020-05-27 NOTE — Progress Notes (Signed)
No allergies to soy or egg Pt is not on blood thinners or diet pills   Denies issues with sedation/intubation although has never been intubed  Denies atrial flutter/fib Denies constipation    Pt is aware of Covid safety and care partner requirements.

## 2020-06-02 ENCOUNTER — Encounter: Payer: Self-pay | Admitting: Internal Medicine

## 2020-06-10 ENCOUNTER — Encounter: Payer: Self-pay | Admitting: Internal Medicine

## 2020-06-10 ENCOUNTER — Other Ambulatory Visit: Payer: Self-pay

## 2020-06-10 ENCOUNTER — Ambulatory Visit (AMBULATORY_SURGERY_CENTER): Payer: No Typology Code available for payment source | Admitting: Internal Medicine

## 2020-06-10 VITALS — BP 124/81 | HR 57 | Temp 97.5°F | Resp 15 | Ht 73.0 in | Wt 199.6 lb

## 2020-06-10 DIAGNOSIS — Z86010 Personal history of colonic polyps: Secondary | ICD-10-CM | POA: Diagnosis not present

## 2020-06-10 MED ORDER — SODIUM CHLORIDE 0.9 % IV SOLN
500.0000 mL | Freq: Once | INTRAVENOUS | Status: DC
Start: 2020-06-10 — End: 2020-06-10

## 2020-06-10 NOTE — Progress Notes (Signed)
Pt. Reports no change in his medical or surgical history since his pre-visit 05/27/2020.

## 2020-06-10 NOTE — Patient Instructions (Signed)
Your next colonoscopy should occur in 10 years.    You may resume your previous diet and medication schedule.  Thank you for allowing [place] to care for you today!!!   YOU HAD AN ENDOSCOPIC PROCEDURE TODAY AT [facility]:   Refer to the procedure report that was given to you for any specific questions about what was found during the examination.  If the procedure report does not answer your questions, please call your gastroenterologist to clarify.  If you requested that your care partner not be given the details of your procedure findings, then the procedure report has been included in a sealed envelope for you to review at your convenience later.  YOU SHOULD EXPECT: Some feelings of bloating in the abdomen. Passage of more gas than usual.  Walking can help get rid of the air that was put into your GI tract during the procedure and reduce the bloating. If you had a lower endoscopy (such as a colonoscopy or flexible sigmoidoscopy) you may notice spotting of blood in your stool or on the toilet paper. If you underwent a bowel prep for your procedure, you may not have a normal bowel movement for a few days.  Please Note:  You might notice some irritation and congestion in your nose or some drainage.  This is from the oxygen used during your procedure.  There is no need for concern and it should clear up in a day or so.  SYMPTOMS TO REPORT IMMEDIATELY:   Following lower endoscopy (colonoscopy or flexible sigmoidoscopy):  Excessive amounts of blood in the stool  Significant tenderness or worsening of abdominal pains  Swelling of the abdomen that is new, acute  Fever of 100F or higher  For urgent or emergent issues, a gastroenterologist can be reached at any hour by calling [phone]. Do not use MyChart messaging for urgent concerns.    DIET:  We do recommend a small meal at first, but then you may proceed to your regular diet.  Drink plenty of fluids but you should avoid  alcoholic beverages for 24 hours.  ACTIVITY:  You should plan to take it easy for the rest of today and you should NOT DRIVE or use heavy machinery until tomorrow (because of the sedation medicines used during the test).    FOLLOW UP: Our staff will call the number listed on your records 48-72 hours following your procedure to check on you and address any questions or concerns that you may have regarding the information given to you following your procedure. If we do not reach you, we will leave a message.  We will attempt to reach you two times.  During this call, we will ask if you have developed any symptoms of COVID 19. If you develop any symptoms (ie: fever, flu-like symptoms, shortness of breath, cough etc.) before then, please call [phone].  If you test positive for Covid 19 in the 2 weeks post procedure, please call and report this information to [place].    If any biopsies were taken you will be contacted by phone or by letter within the next 1-3 weeks.  Please call us at [phone] if you have not heard about the biopsies in 3 weeks.    SIGNATURES/CONFIDENTIALITY: You and/or your care partner have signed paperwork which will be entered into your electronic medical record.  These signatures attest to the fact that that the information above on your After Visit Summary has been reviewed and is understood.  Full  responsibility of the confidentiality of this discharge information lies with you and/or your care-partner.

## 2020-06-10 NOTE — Progress Notes (Signed)
A/ox3, pleased with MAC, report to RN 

## 2020-06-10 NOTE — Op Note (Signed)
[facility] Patient Name: Tommy Ramirez Procedure Date: 06/10/2020 1:26 PM MRN: [id number] Endoscopist: Jerene Bears , MD Age: 59 Referring MD:  Date of Birth: [birth year] Gender: Male Account #: [id number] Procedure:                Colonoscopy Indications:              High risk colon cancer surveillance: Personal                            history of sessile serrated colon polyp (less than                            10 mm in size) with no dysplasia, Last colonoscopy:                            March 2015 Medicines:                Monitored Anesthesia Care Procedure:                Pre-Anesthesia Assessment:                           - Prior to the procedure, a History and Physical                            was performed, and patient medications and                            allergies were reviewed. The patient's tolerance of                            previous anesthesia was also reviewed. The risks                            and benefits of the procedure and the sedation                            options and risks were discussed with the patient.                            All questions were answered, and informed consent                            was obtained. Prior Anticoagulants: The patient has                            taken no previous anticoagulant or antiplatelet                            agents. ASA Grade Assessment: I - A normal, healthy                            patient. After reviewing the risks and benefits,  the patient was deemed in satisfactory condition to                            undergo the procedure.                           After obtaining informed consent, the colonoscope                            was passed under direct vision. Throughout the                            procedure, the patient's blood pressure, pulse, and                            oxygen saturations were monitored continuously. The                             Colonoscope was introduced through the anus and                            advanced to the the cecum. The colonoscopy was                            performed without difficulty. The patient tolerated                            the procedure well. The quality of the bowel                            preparation was good. The ileocecal valve,                            appendiceal orifice, and rectum were photographed. Scope In: 1:39:04 PM Scope Out: 1:56:34 PM Scope Withdrawal Time: 0 hours 10 minutes 28 seconds  Total Procedure Duration: 0 hours 17 minutes 30 seconds  Findings:                 The digital rectal exam was normal.                           The entire examined colon appeared normal on direct                            and retroflexion views. Complications:            No immediate complications. Estimated Blood Loss:     Estimated blood loss: none. Impression:               - The entire examined colon is normal on direct and                            retroflexion views.                           - No specimens collected.  Recommendation:           - Patient has a contact number available for                            emergencies. The signs and symptoms of potential                            delayed complications were discussed with the                            patient. Return to normal activities tomorrow.                            Written discharge instructions were provided to the                            patient.                           - Resume previous diet.                           - Continue present medications.                           - Repeat colonoscopy in 10 years for surveillance. Jerene Bears, MD 06/10/2020 1:59:33 PM This report has been signed electronically.

## 2020-06-14 ENCOUNTER — Telehealth: Payer: Self-pay

## 2020-06-14 NOTE — Telephone Encounter (Signed)
  Follow up Call-  Call back number 06/10/2020  Post procedure Call Back phone  # [phone]  Permission to leave phone message Yes  Some recent data might be hidden     Patient questions:  Do you have a fever, pain , or abdominal swelling? No. Pain Score  0 *  Have you tolerated food without any problems? Yes.    Have you been able to return to your normal activities? Yes.    Do you have any questions about your discharge instructions: Diet   No. Medications  No. Follow up visit  No.  Do you have questions or concerns about your Care? No.  Actions: * If pain score is 4 or above: No action needed, pain <4.  1. Have you developed a fever since your procedure? no  2.   Have you had an respiratory symptoms (SOB or cough) since your procedure? no  3.   Have you tested positive for COVID 19 since your procedure no  4.   Have you had any family members/close contacts diagnosed with the COVID 19 since your procedure?  no   If yes to any of these questions please route to Joylene John, RN and Erenest Rasher, RN

## 2020-06-14 NOTE — Telephone Encounter (Signed)
Left message on follow up call. 

## 2020-08-16 ENCOUNTER — Encounter: Payer: Self-pay | Admitting: Cardiovascular Disease

## 2020-10-15 DIAGNOSIS — Z23 Encounter for immunization: Secondary | ICD-10-CM | POA: Diagnosis not present

## 2020-11-01 DIAGNOSIS — R972 Elevated prostate specific antigen [PSA]: Secondary | ICD-10-CM | POA: Diagnosis not present

## 2021-02-22 DIAGNOSIS — E785 Hyperlipidemia, unspecified: Secondary | ICD-10-CM | POA: Diagnosis not present

## 2021-02-22 DIAGNOSIS — Z0000 Encounter for general adult medical examination without abnormal findings: Secondary | ICD-10-CM | POA: Diagnosis not present

## 2021-02-22 DIAGNOSIS — R7301 Impaired fasting glucose: Secondary | ICD-10-CM | POA: Diagnosis not present

## 2021-02-27 ENCOUNTER — Other Ambulatory Visit: Payer: Self-pay | Admitting: Internal Medicine

## 2021-02-27 DIAGNOSIS — Z0000 Encounter for general adult medical examination without abnormal findings: Secondary | ICD-10-CM | POA: Diagnosis not present

## 2021-02-27 DIAGNOSIS — E785 Hyperlipidemia, unspecified: Secondary | ICD-10-CM

## 2021-02-27 DIAGNOSIS — R82998 Other abnormal findings in urine: Secondary | ICD-10-CM | POA: Diagnosis not present

## 2021-02-27 DIAGNOSIS — Z1331 Encounter for screening for depression: Secondary | ICD-10-CM | POA: Diagnosis not present

## 2021-02-27 DIAGNOSIS — Z125 Encounter for screening for malignant neoplasm of prostate: Secondary | ICD-10-CM | POA: Diagnosis not present

## 2021-02-27 DIAGNOSIS — Z1212 Encounter for screening for malignant neoplasm of rectum: Secondary | ICD-10-CM | POA: Diagnosis not present

## 2021-08-18 DIAGNOSIS — M19011 Primary osteoarthritis, right shoulder: Secondary | ICD-10-CM | POA: Diagnosis not present

## 2021-09-23 DIAGNOSIS — Z23 Encounter for immunization: Secondary | ICD-10-CM | POA: Diagnosis not present

## 2022-02-16 DIAGNOSIS — D2272 Melanocytic nevi of left lower limb, including hip: Secondary | ICD-10-CM | POA: Diagnosis not present

## 2022-02-16 DIAGNOSIS — D225 Melanocytic nevi of trunk: Secondary | ICD-10-CM | POA: Diagnosis not present

## 2022-02-16 DIAGNOSIS — L814 Other melanin hyperpigmentation: Secondary | ICD-10-CM | POA: Diagnosis not present

## 2022-02-16 DIAGNOSIS — L821 Other seborrheic keratosis: Secondary | ICD-10-CM | POA: Diagnosis not present

## 2022-02-21 DIAGNOSIS — E785 Hyperlipidemia, unspecified: Secondary | ICD-10-CM | POA: Diagnosis not present

## 2022-02-21 DIAGNOSIS — Z125 Encounter for screening for malignant neoplasm of prostate: Secondary | ICD-10-CM | POA: Diagnosis not present

## 2022-02-21 DIAGNOSIS — R7301 Impaired fasting glucose: Secondary | ICD-10-CM | POA: Diagnosis not present

## 2022-03-01 DIAGNOSIS — Z1212 Encounter for screening for malignant neoplasm of rectum: Secondary | ICD-10-CM | POA: Diagnosis not present

## 2022-03-01 DIAGNOSIS — R82998 Other abnormal findings in urine: Secondary | ICD-10-CM | POA: Diagnosis not present

## 2022-03-01 DIAGNOSIS — Z1331 Encounter for screening for depression: Secondary | ICD-10-CM | POA: Diagnosis not present

## 2022-03-01 DIAGNOSIS — Z0000 Encounter for general adult medical examination without abnormal findings: Secondary | ICD-10-CM | POA: Diagnosis not present

## 2022-03-01 DIAGNOSIS — E785 Hyperlipidemia, unspecified: Secondary | ICD-10-CM | POA: Diagnosis not present

## 2022-03-02 ENCOUNTER — Other Ambulatory Visit: Payer: Self-pay | Admitting: Internal Medicine

## 2022-03-02 DIAGNOSIS — Z8249 Family history of ischemic heart disease and other diseases of the circulatory system: Secondary | ICD-10-CM

## 2022-09-29 DIAGNOSIS — Z23 Encounter for immunization: Secondary | ICD-10-CM | POA: Diagnosis not present

## 2023-02-20 DIAGNOSIS — L821 Other seborrheic keratosis: Secondary | ICD-10-CM | POA: Diagnosis not present

## 2023-02-20 DIAGNOSIS — D2271 Melanocytic nevi of right lower limb, including hip: Secondary | ICD-10-CM | POA: Diagnosis not present

## 2023-02-20 DIAGNOSIS — L814 Other melanin hyperpigmentation: Secondary | ICD-10-CM | POA: Diagnosis not present

## 2023-02-20 DIAGNOSIS — L570 Actinic keratosis: Secondary | ICD-10-CM | POA: Diagnosis not present

## 2023-02-20 DIAGNOSIS — D225 Melanocytic nevi of trunk: Secondary | ICD-10-CM | POA: Diagnosis not present

## 2023-03-04 DIAGNOSIS — R7301 Impaired fasting glucose: Secondary | ICD-10-CM | POA: Diagnosis not present

## 2023-03-04 DIAGNOSIS — Z125 Encounter for screening for malignant neoplasm of prostate: Secondary | ICD-10-CM | POA: Diagnosis not present

## 2023-03-04 DIAGNOSIS — E785 Hyperlipidemia, unspecified: Secondary | ICD-10-CM | POA: Diagnosis not present

## 2023-03-07 ENCOUNTER — Other Ambulatory Visit: Payer: Self-pay | Admitting: Internal Medicine

## 2023-03-07 DIAGNOSIS — R7301 Impaired fasting glucose: Secondary | ICD-10-CM | POA: Diagnosis not present

## 2023-03-07 DIAGNOSIS — R972 Elevated prostate specific antigen [PSA]: Secondary | ICD-10-CM | POA: Diagnosis not present

## 2023-03-07 DIAGNOSIS — R82998 Other abnormal findings in urine: Secondary | ICD-10-CM | POA: Diagnosis not present

## 2023-03-07 DIAGNOSIS — Z0000 Encounter for general adult medical examination without abnormal findings: Secondary | ICD-10-CM | POA: Diagnosis not present

## 2023-03-07 DIAGNOSIS — Z1331 Encounter for screening for depression: Secondary | ICD-10-CM | POA: Diagnosis not present

## 2023-03-07 DIAGNOSIS — M549 Dorsalgia, unspecified: Secondary | ICD-10-CM | POA: Diagnosis not present

## 2023-03-07 DIAGNOSIS — E785 Hyperlipidemia, unspecified: Secondary | ICD-10-CM

## 2023-05-27 DIAGNOSIS — Z1212 Encounter for screening for malignant neoplasm of rectum: Secondary | ICD-10-CM | POA: Diagnosis not present

## 2023-09-21 DIAGNOSIS — Z23 Encounter for immunization: Secondary | ICD-10-CM | POA: Diagnosis not present

## 2024-02-25 DIAGNOSIS — B353 Tinea pedis: Secondary | ICD-10-CM | POA: Diagnosis not present

## 2024-02-25 DIAGNOSIS — D1801 Hemangioma of skin and subcutaneous tissue: Secondary | ICD-10-CM | POA: Diagnosis not present

## 2024-02-25 DIAGNOSIS — L821 Other seborrheic keratosis: Secondary | ICD-10-CM | POA: Diagnosis not present

## 2024-03-10 DIAGNOSIS — R7301 Impaired fasting glucose: Secondary | ICD-10-CM | POA: Diagnosis not present

## 2024-03-10 DIAGNOSIS — E785 Hyperlipidemia, unspecified: Secondary | ICD-10-CM | POA: Diagnosis not present

## 2024-03-10 DIAGNOSIS — R972 Elevated prostate specific antigen [PSA]: Secondary | ICD-10-CM | POA: Diagnosis not present

## 2024-03-10 DIAGNOSIS — Z1212 Encounter for screening for malignant neoplasm of rectum: Secondary | ICD-10-CM | POA: Diagnosis not present

## 2024-03-13 DIAGNOSIS — R82998 Other abnormal findings in urine: Secondary | ICD-10-CM | POA: Diagnosis not present

## 2024-03-13 DIAGNOSIS — E785 Hyperlipidemia, unspecified: Secondary | ICD-10-CM | POA: Diagnosis not present

## 2024-03-13 DIAGNOSIS — Z0000 Encounter for general adult medical examination without abnormal findings: Secondary | ICD-10-CM | POA: Diagnosis not present

## 2024-03-13 DIAGNOSIS — Z1339 Encounter for screening examination for other mental health and behavioral disorders: Secondary | ICD-10-CM | POA: Diagnosis not present

## 2024-03-13 DIAGNOSIS — Z1331 Encounter for screening for depression: Secondary | ICD-10-CM | POA: Diagnosis not present

## 2024-09-19 DIAGNOSIS — Z23 Encounter for immunization: Secondary | ICD-10-CM | POA: Diagnosis not present

## 2024-09-25 DIAGNOSIS — E785 Hyperlipidemia, unspecified: Secondary | ICD-10-CM | POA: Diagnosis not present
